# Patient Record
Sex: Female | Born: 1960 | Race: White | Hispanic: No | Marital: Married
Health system: Southern US, Community
[De-identification: ages and names within clinical notes are randomized; demographics above are authoritative.]

---

## 2009-01-09 ENCOUNTER — Other Ambulatory Visit: Admission: RE | Admit: 2009-01-09 | Discharge: 2009-01-09 | Payer: Self-pay | Admitting: Obstetrics and Gynecology

## 2009-01-09 ENCOUNTER — Ambulatory Visit: Payer: Self-pay | Admitting: Obstetrics & Gynecology

## 2009-01-23 ENCOUNTER — Ambulatory Visit: Payer: Self-pay | Admitting: Obstetrics and Gynecology

## 2009-08-15 ENCOUNTER — Ambulatory Visit: Payer: Self-pay | Admitting: Obstetrics and Gynecology

## 2011-04-28 NOTE — Group Therapy Note (Signed)
Brandy Rivers, Brandy Rivers            ACCOUNT NO.:  [id number]   MEDICAL RECORD NO.:  [id number]          PATIENT TYPE:  WOC   LOCATION:  [facility]                   FACILITY:  [facility]   PHYSICIAN:  Catalina Antigua, MD     DATE OF BIRTH:  [birth year]   DATE OF SERVICE:                                  CLINIC NOTE   This is a 50 year old gravida 1, para 0 with a history of abnormal Pap  smear initially ASCUS with positive high-risk HPV.  Colposcopy  demonstrated benign mucosa and normal ECC and plan was for a repeat Pap  smear in 6 months.  The patient is currently without any complaints.  Denied any abnormal bleeding or discharge.  Repeat Pap smear was  performed.  The patient will be contacted.  Return at next appointment.           ______________________________  Catalina Antigua, MD     PC/MEDQ  D:  08/15/2009  T:  08/15/2009  Job:  [id number]

## 2020-09-25 ENCOUNTER — Other Ambulatory Visit: Payer: Self-pay

## 2020-09-25 DIAGNOSIS — Z1231 Encounter for screening mammogram for malignant neoplasm of breast: Secondary | ICD-10-CM

## 2020-10-07 ENCOUNTER — Other Ambulatory Visit: Payer: Self-pay

## 2020-10-07 ENCOUNTER — Ambulatory Visit
Admission: RE | Admit: 2020-10-07 | Discharge: 2020-10-07 | Disposition: A | Discharge status: Home / Self Care | Payer: No Typology Code available for payment source | Source: Ambulatory Visit | Attending: Adult Health | Admitting: Adult Health

## 2020-10-07 DIAGNOSIS — Z1231 Encounter for screening mammogram for malignant neoplasm of breast: Secondary | ICD-10-CM

## 2020-12-24 ENCOUNTER — Ambulatory Visit (INDEPENDENT_AMBULATORY_CARE_PROVIDER_SITE_OTHER): Payer: Self-pay | Admitting: Sports Medicine

## 2020-12-24 ENCOUNTER — Encounter: Payer: Self-pay | Admitting: Sports Medicine

## 2020-12-24 ENCOUNTER — Other Ambulatory Visit: Payer: Self-pay

## 2020-12-24 DIAGNOSIS — L84 Corns and callosities: Secondary | ICD-10-CM

## 2020-12-24 DIAGNOSIS — M79674 Pain in right toe(s): Secondary | ICD-10-CM

## 2020-12-24 NOTE — Progress Notes (Signed)
Subjective: Brandy Rivers is a 60 y.o. female patient who presents to office for evaluation of Right first toe pain secondary to callus/corn skin. Patient complains of pain at the lesion present Right at the lateral first toe at the interspace.  Patient has tried soaking and corn pads with no complete relief in symptoms, still has some pain when in shoes especially closed toed. Patient denies any other pedal complaints.   Review of systems noncontributory.  There are no problems to display for this patient.   Current Outpatient Medications on File Prior to Visit  Medication Sig Dispense Refill  . busPIRone (BUSPAR) 5 MG tablet Take 5 mg by mouth 3 (three) times daily.    Marland Kitchen omeprazole (PRILOSEC) 20 MG capsule Take 20 mg by mouth daily.     No current facility-administered medications on file prior to visit.    Not on File  Objective:  General: Alert and oriented x3 in no acute distress  Dermatology: Keratotic lesion present right hallux lateral toe at the interspace adjacent to the medial fold, pain is present with direct pressure to the lesion with a central nucleated core noted, no acute ingrowing noted at right hallux nail, no webspace macerations, no ecchymosis bilateral, all nails x 10 are well manicured.  Vascular: Dorsalis Pedis and Posterior Tibial pedal pulses 2/4, Capillary Fill Time 3 seconds, + pedal hair growth bilateral, no edema bilateral lower extremities, Temperature gradient within normal limits.  Neurology: Michaell Cowing sensation intact via light touch bilateral.  Musculoskeletal: Mild tenderness with palpation at the keratotic lesion site on Right hallux, muscular strength 5/5 in all groups without pain or limitation on range of motion. No lower extremity muscular or boney deformity noted.  Assessment and Plan: Problem List Items Addressed This Visit   None   Visit Diagnoses    Corn of toe    -  Primary   Toe pain, right          -Complete examination  performed -Discussed treatment options for corn of toe -Parred keratoic lesion using a tissue nipper; treated the area with antibiotic cream and Band-Aid advised patient that she may continue with Neosporin pain relief as needed -Encouraged soaking with warm water Epson salt as needed -Advised good supportive shoes that do not rub toe and may use toe As dispensed at today's visit -Patient to return to office as needed or sooner if condition worsens.  Asencion Islam, DPM

## 2021-09-29 ENCOUNTER — Other Ambulatory Visit: Payer: Self-pay | Admitting: Student

## 2021-09-29 ENCOUNTER — Other Ambulatory Visit: Payer: Self-pay | Admitting: Family Medicine

## 2021-09-29 DIAGNOSIS — Z1231 Encounter for screening mammogram for malignant neoplasm of breast: Secondary | ICD-10-CM

## 2021-10-01 ENCOUNTER — Other Ambulatory Visit: Payer: Self-pay | Admitting: Family Medicine

## 2021-10-01 DIAGNOSIS — Z1231 Encounter for screening mammogram for malignant neoplasm of breast: Secondary | ICD-10-CM

## 2021-10-05 IMAGING — MG DIGITAL SCREENING BILAT W/ TOMO W/ CAD
8 series · 8 of 24 positions shown · non-contrast
Comparison: Previous exam(s).

CLINICAL DATA: Screening.

EXAM:
DIGITAL SCREENING BILATERAL MAMMOGRAM WITH TOMO AND CAD

[L CC synth-2D]
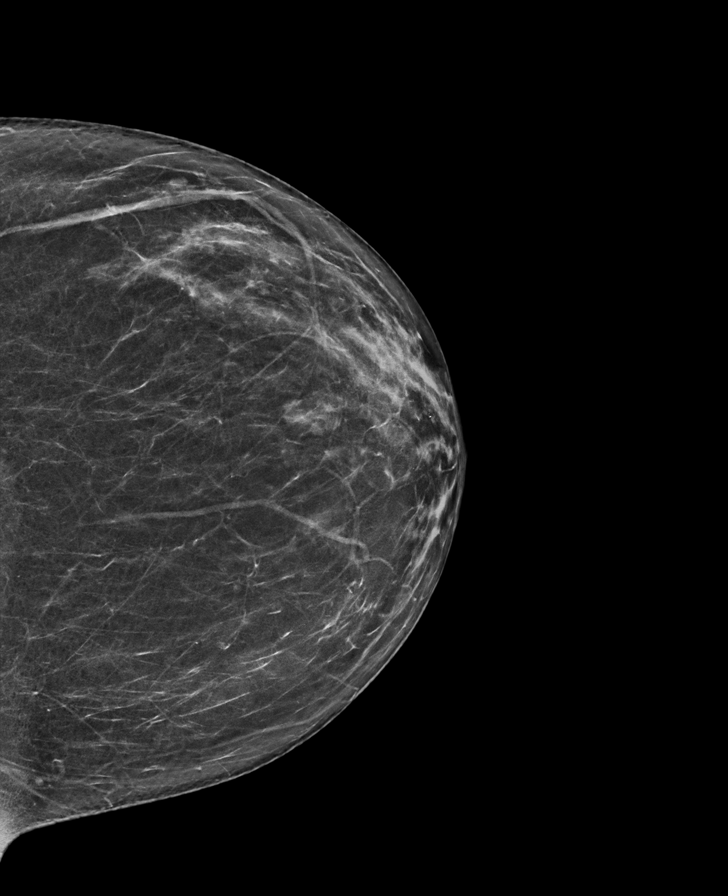

[R CC synth-2D]
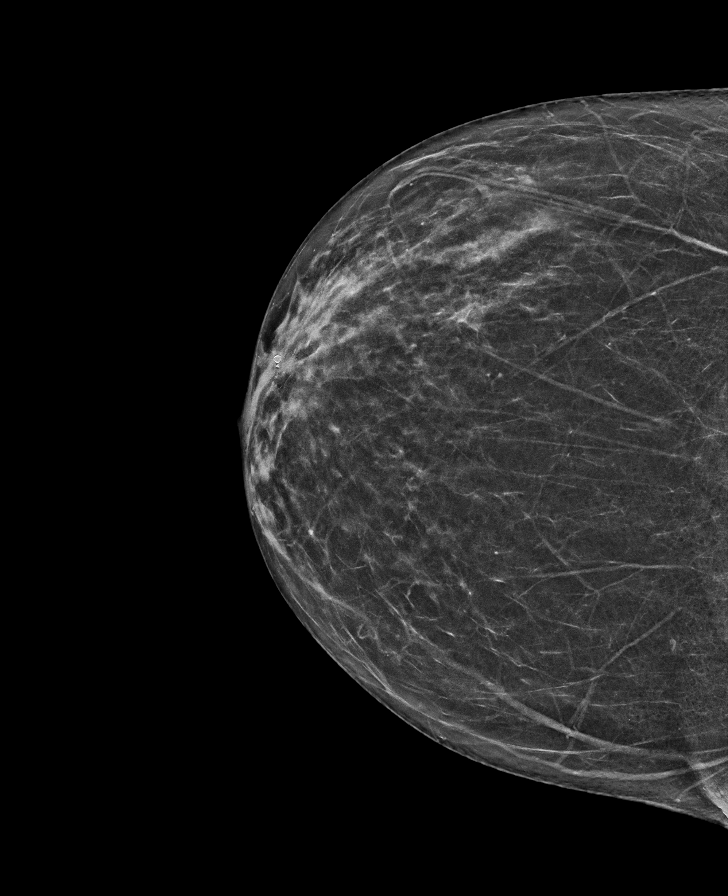

[R MLO synth-2D]
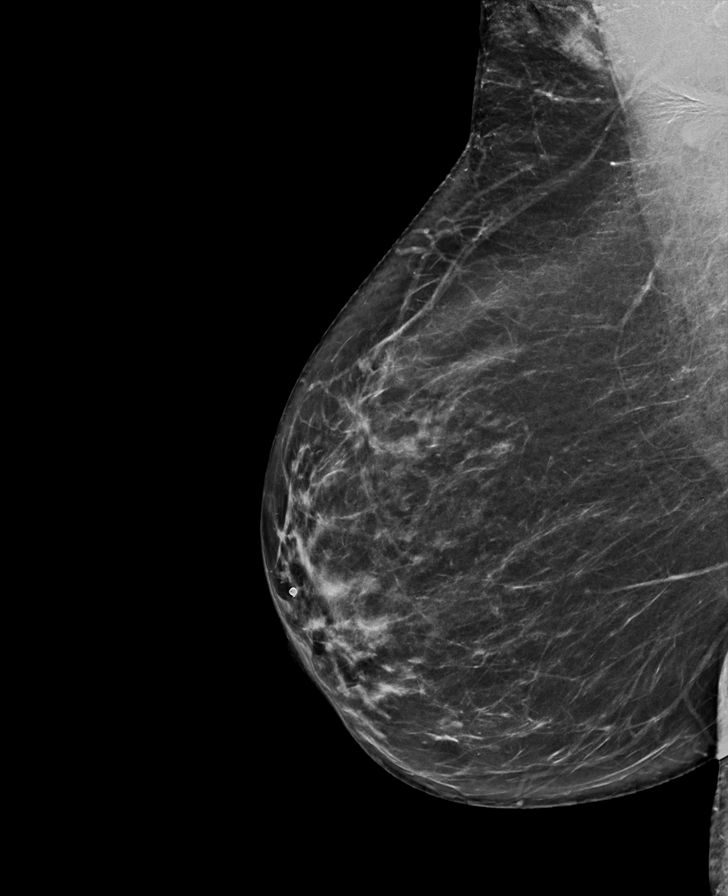

[L MLO synth-2D]
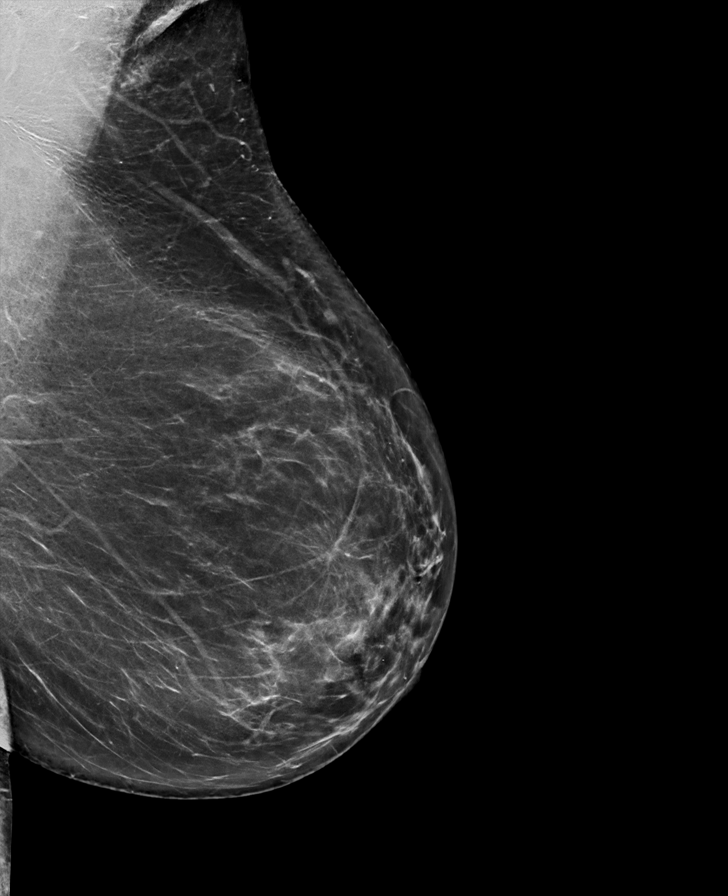

[R CC tomo · tomo slice 31/61.0]
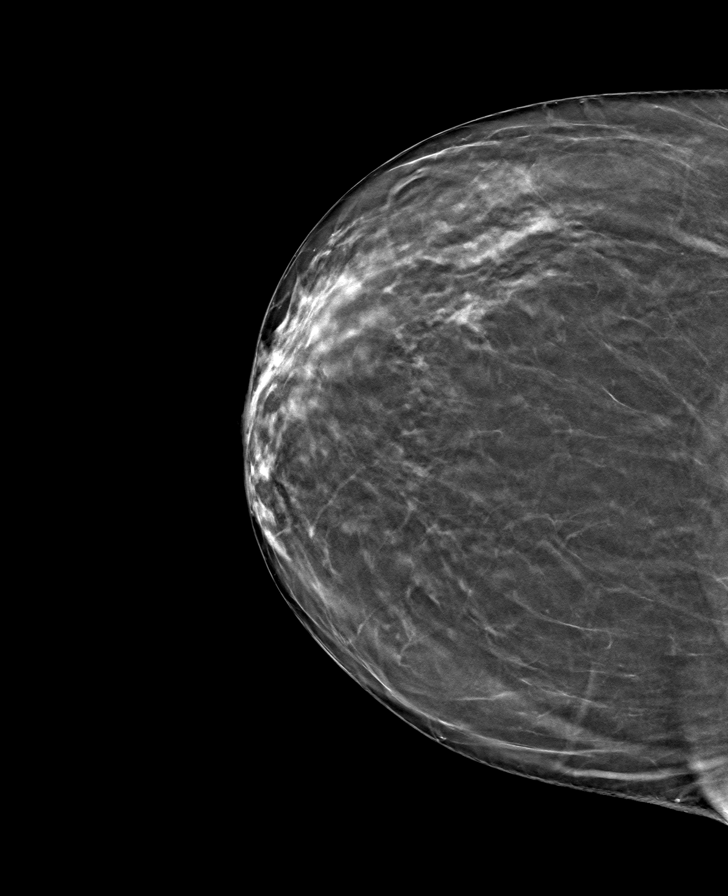

[L MLO tomo · tomo slice 42/83.0]
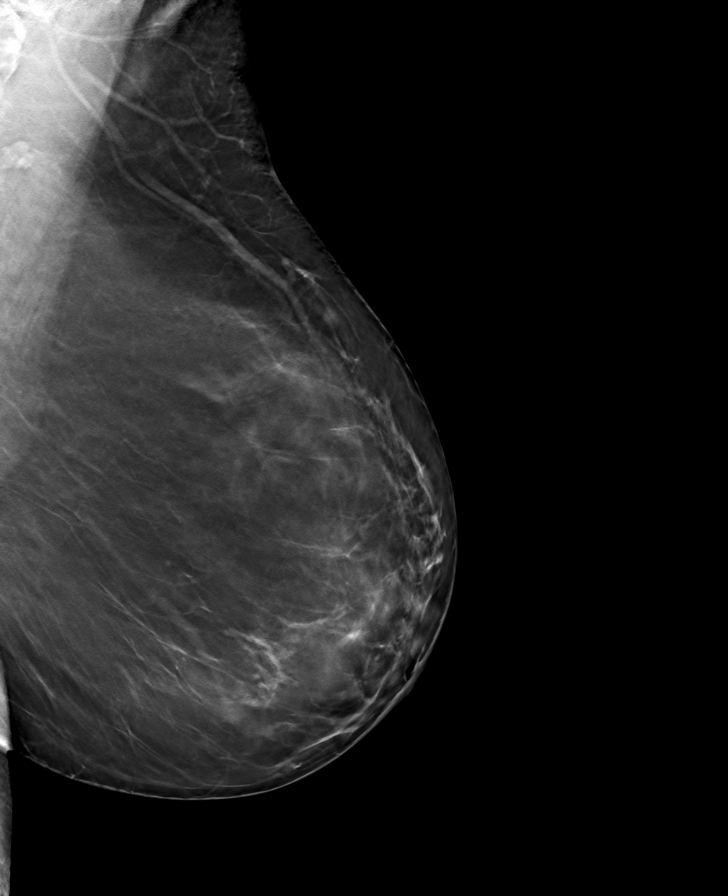

[R MLO tomo · tomo slice 37/74.0]
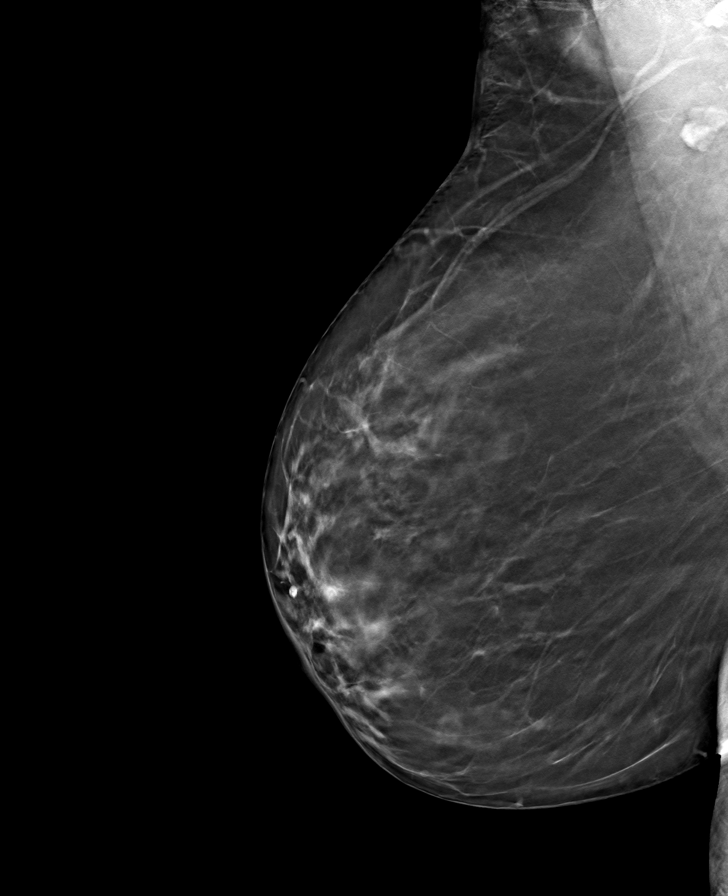

[L CC tomo · tomo slice 34/67.0]
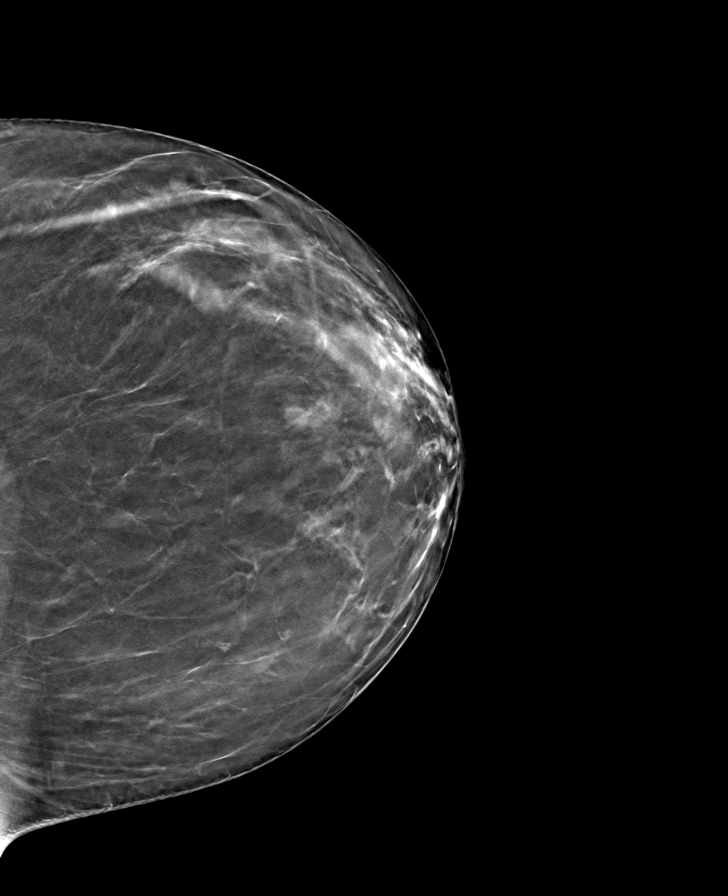

[8 of 24 positions shown; findings below may reference images not displayed]

ACR Breast Density Category b: There are scattered areas of
fibroglandular density.
FINDINGS: There are no findings suspicious for malignancy. Images were
processed with CAD.
IMPRESSION: No mammographic evidence of malignancy. A result letter of this
screening mammogram will be mailed directly to the patient.

RECOMMENDATION:
Screening mammogram in one year. (Code:[id number])

BI-RADS CATEGORY  1: Negative.

## 2021-12-09 ENCOUNTER — Inpatient Hospital Stay: Admission: RE | Admit: 2021-12-09 | Payer: No Typology Code available for payment source | Source: Ambulatory Visit

## 2022-01-14 ENCOUNTER — Other Ambulatory Visit: Payer: Self-pay

## 2022-01-14 ENCOUNTER — Ambulatory Visit
Admission: RE | Admit: 2022-01-14 | Discharge: 2022-01-14 | Disposition: A | Discharge status: Home / Self Care | Payer: No Typology Code available for payment source | Source: Ambulatory Visit | Attending: Family Medicine | Admitting: Family Medicine

## 2022-01-14 DIAGNOSIS — Z1231 Encounter for screening mammogram for malignant neoplasm of breast: Secondary | ICD-10-CM
